# Patient Record
Sex: Female | Born: 2000
Health system: Southern US, Community
[De-identification: ages and names within clinical notes are randomized; demographics above are authoritative.]

## PROBLEM LIST (undated history)

## (undated) DIAGNOSIS — J45909 Unspecified asthma, uncomplicated: Secondary | ICD-10-CM

## (undated) DIAGNOSIS — H53001 Unspecified amblyopia, right eye: Secondary | ICD-10-CM

## (undated) DIAGNOSIS — H1010 Acute atopic conjunctivitis, unspecified eye: Secondary | ICD-10-CM

## (undated) DIAGNOSIS — R55 Syncope and collapse: Secondary | ICD-10-CM

## (undated) DIAGNOSIS — Z8719 Personal history of other diseases of the digestive system: Secondary | ICD-10-CM

## (undated) DIAGNOSIS — F410 Panic disorder [episodic paroxysmal anxiety] without agoraphobia: Secondary | ICD-10-CM

## (undated) DIAGNOSIS — J309 Allergic rhinitis, unspecified: Secondary | ICD-10-CM

## (undated) DIAGNOSIS — K529 Noninfective gastroenteritis and colitis, unspecified: Secondary | ICD-10-CM

## (undated) HISTORY — DX: Unspecified asthma, uncomplicated: J45.909

## (undated) HISTORY — DX: Acute atopic conjunctivitis, unspecified eye: H10.10

## (undated) HISTORY — DX: Syncope and collapse: R55

## (undated) HISTORY — PX: NO PAST SURGERIES: SHX2092

## (undated) HISTORY — DX: Panic disorder (episodic paroxysmal anxiety): F41.0

## (undated) HISTORY — DX: Personal history of other diseases of the digestive system: Z87.19

## (undated) HISTORY — DX: Allergic rhinitis, unspecified: J30.9

## (undated) HISTORY — DX: Noninfective gastroenteritis and colitis, unspecified: K52.9

## (undated) HISTORY — DX: Unspecified amblyopia, right eye: H53.001

---
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
## 2022-01-24 ENCOUNTER — Other Ambulatory Visit: Payer: Self-pay | Admitting: Pediatrics

## 2022-01-24 MED ORDER — NORGESTIM-ETH ESTRAD TRIPHASIC 0.18/0.215/0.25 MG-35 MCG PO TABS: 1.0000 | ORAL_TABLET | Freq: Every day | ORAL | 12 refills | Status: DC

## 2022-03-04 ENCOUNTER — Ambulatory Visit
Admission: RE | Admit: 2022-03-04 | Discharge: 2022-03-04 | Disposition: A | Discharge status: Home / Self Care | Payer: No Typology Code available for payment source | Source: Ambulatory Visit | Attending: Family Medicine | Admitting: Family Medicine

## 2022-03-04 VITALS — BP 106/65 | HR 81 | Temp 98.7°F | Resp 14

## 2022-03-04 DIAGNOSIS — L739 Follicular disorder, unspecified: Secondary | ICD-10-CM | POA: Diagnosis not present

## 2022-03-04 MED ORDER — SULFAMETHOXAZOLE-TRIMETHOPRIM 800-160 MG PO TABS: 1.0000 | ORAL_TABLET | Freq: Two times a day (BID) | ORAL | 0 refills | Status: AC

## 2022-03-04 NOTE — ED Provider Notes (Signed)
[facility]    CSN: [id number] Arrival date & time: 03/04/22  1323      History   Chief Complaint Chief Complaint  Patient presents with   Groin Pain    Lymph node is swollen - Entered by patient    HPI Tahnia Betker is a 21 y.o. female.   HPI 21 year old female presents with right-sided groin pain that has been an occurring for several weeks.  Patient reports palpable bump at site of pain appears to be bruised on the exterior.  She is accompanied by her mother this afternoon  Past Medical History:  Diagnosis Date   Allergic rhinoconjunctivitis    Chronic cough   Amblyopia ex anopsia of right eye    Also has an astigmatism   Asthma    Gastroenteritis    Generalized anxiety disorder with panic attacks    Reasonably well-controlled on sertraline   H/O gastroenteritis    Ongoing issues with nausea vomiting, poor p.o. tolerance, diarrhea.   Recurrent syncope     Patient Active Problem List   Diagnosis Date Noted   Syncope and collapse 02/20/2021   Orthostatic dizziness 02/20/2021   Asthma 01/25/2021   Gastroenteritis 01/25/2021   Panic attack 03/13/2020   Sleep disturbance 03/13/2020   Mixed obsessional thoughts and acts 03/13/2020   Adjustment disorder with anxiety 10/10/2015   Weight loss 10/10/2015   Mild persistent asthma 07/12/2015   Allergic rhinoconjunctivitis 07/12/2015    Past Surgical History:  Procedure Laterality Date   NO PAST SURGERIES      OB History   No obstetric history on file.      Home Medications    Prior to Admission medications   Medication Sig Start Date End Date Taking? Authorizing Provider  sulfamethoxazole-trimethoprim (BACTRIM DS) 800-160 MG tablet Take 1 tablet by mouth 2 (two) times daily for 7 days. 03/04/22 03/11/22 Yes Eliezer Lofts, FNP  BENZACLIN gel Apply topically every morning. 03/14/21   Parthenia Ames, NP  desvenlafaxine (PRISTIQ) 50 MG 24 hr tablet Take 1 tablet (50 mg total) by mouth daily. 12/06/21    Parthenia Ames, NP  dicyclomine (BENTYL) 20 MG tablet Take 20 mg by mouth 3 (three) times daily as needed. 02/03/21   [provider]  hydrOXYzine (ATARAX) 10 MG tablet Take 1 tablet (10 mg total) by mouth 3 (three) times daily as needed for anxiety. 12/06/21   Parthenia Ames, NP  midodrine (PROAMATINE) 2.5 MG tablet Take 1 tablet every morning 04/06/21   Leonie Man, MD  Norgestimate-Ethinyl Estradiol Triphasic (TRI-ESTARYLLA) 0.18/0.215/0.25 MG-35 MCG tablet Take 1 tablet by mouth daily. 01/24/22   Trude Mcburney, FNP  pantoprazole (PROTONIX) 20 MG tablet Take 20 mg by mouth every morning. Patient not taking: Reported on 03/04/2022 02/25/21   [provider]  Pediatric Multiple Vit-C-FA (CHILDRENS CHEWABLE VITAMINS) chewable tablet Chew by mouth.    [provider]  sertraline (ZOLOFT) 50 MG tablet Take 1 tablet (50 mg total) by mouth daily. 12/06/21   Parthenia Ames, NP  XIFAXAN 550 MG TABS tablet Take 550 mg by mouth 3 (three) times daily. 02/13/21   [provider]    Family History Family History  Problem Relation Age of Onset   Congenital heart disease Mother        PFO-status post closure   Stroke Mother 86       Related to be of a   Skin cancer Father    Asthma Brother  Allergic rhinitis Brother     Social History Social History   Tobacco Use   Smoking status: Never   Smokeless tobacco: Never  Substance Use Topics   Alcohol use: No    Alcohol/week: 0.0 standard drinks of alcohol   Drug use: No     Allergies   Patient has no known allergies.   Review of Systems Review of Systems  Musculoskeletal:        Right-sided groin pain x2 to 3 weeks  All other systems reviewed and are negative.    Physical Exam Triage Vital Signs ED Triage Vitals  Enc Vitals Group     BP 03/04/22 1339 106/65     Pulse Rate 03/04/22 1339 81     Resp 03/04/22 1339 14     Temp 03/04/22 1339 98.7 F (37.1 C)     Temp Source 03/04/22 1339  Oral     SpO2 03/04/22 1339 98 %     Weight --      Height --      Head Circumference --      Peak Flow --      Pain Score 03/04/22 1338 0     Pain Loc --      Pain Edu? --      Excl. in GC? --    No data found.  Updated Vital Signs BP 106/65 (BP Location: Left Arm)   Pulse 81   Temp 98.7 F (37.1 C) (Oral)   Resp 14   SpO2 98%       Physical Exam Vitals and nursing note reviewed. Exam conducted with a chaperone present.  Constitutional:      General: She is not in acute distress.    Appearance: Normal appearance. She is normal weight. She is not ill-appearing.  HENT:     Head: Normocephalic and atraumatic.     Mouth/Throat:     Mouth: Mucous membranes are moist.     Pharynx: Oropharynx is clear.  Eyes:     Extraocular Movements: Extraocular movements intact.     Conjunctiva/sclera: Conjunctivae normal.     Pupils: Pupils are equal, round, and reactive to light.  Cardiovascular:     Rate and Rhythm: Normal rate and regular rhythm.     Pulses: Normal pulses.     Heart sounds: Normal heart sounds.  Pulmonary:     Effort: Pulmonary effort is normal.     Breath sounds: Normal breath sounds. No wheezing, rhonchi or rales.  Musculoskeletal:        General: Normal range of motion.     Cervical back: Normal range of motion and neck supple.  Skin:    General: Skin is warm and dry.     Comments: Right inguinal crease: Tiny (<0.5 cm) circular shaped mildly erythematous pustular papule, no discharge/drainage/bleeding, no evidence of engorged lymph node of right sided groin noted  Neurological:     General: No focal deficit present.     Mental Status: She is alert and oriented to person, place, and time.      UC Treatments / Results  Labs (all labs ordered are listed, but only abnormal results are displayed) Labs Reviewed - No data to display  EKG   Radiology No results found.  Procedures Procedures (including critical care time)  Medications Ordered in  UC Medications - No data to display  Initial Impression / Assessment and Plan / UC Course  I have reviewed the triage vital signs and the nursing notes.  Pertinent labs & imaging results that were available during my care of the patient were reviewed by me and considered in my medical decision making (see chart for details).     MDM: 1.  Folliculitis-Rx'd Bactrim. Advised patient to take medication as directed with food to completion.  Encouraged patient increase daily water intake while taking this medication.  Advised patient if symptoms worsen and/or unresolved please follow-up with PCP, GYN, or here for further evaluation.  Patient discharged home, hemodynamically stable. Final Clinical Impressions(s) / UC Diagnoses   Final diagnoses:  Folliculitis     Discharge Instructions      Advised patient to take medication as directed with food to completion.  Encouraged patient increase daily water intake while taking this medication.  Advised patient if symptoms worsen and/or unresolved please follow-up with PCP, GYN, or here for further evaluation.     ED Prescriptions     Medication Sig Dispense Auth. Provider   sulfamethoxazole-trimethoprim (BACTRIM DS) 800-160 MG tablet Take 1 tablet by mouth 2 (two) times daily for 7 days. 14 tablet Trevor Iha, FNP      PDMP not reviewed this encounter.   Trevor Iha, FNP 03/04/22 1429

## 2022-03-04 NOTE — ED Triage Notes (Signed)
Pt presents with rt sided groin pain that has been occurring "for several weeks". Pt states there is a palpable bump at the site of the pain that appears to be bruised on the exterior.

## 2022-03-04 NOTE — Discharge Instructions (Addendum)
Advised patient to take medication as directed with food to completion.  Encouraged patient increase daily water intake while taking this medication.  Advised patient if symptoms worsen and/or unresolved please follow-up with PCP, GYN, or here for further evaluation.

## 2022-03-05 ENCOUNTER — Telehealth: Payer: Self-pay

## 2022-03-05 NOTE — Telephone Encounter (Signed)
Patient called to see if she could get her in-person appointment for 03/06/22 changed to virtual.  Patient stated that she starts school on tomorrow.  Provider and front desk supervisor consulted.  Appointment able to be modified.  Patient made aware of the change.

## 2022-03-06 ENCOUNTER — Encounter: Payer: Self-pay | Admitting: Family

## 2022-03-06 ENCOUNTER — Telehealth (INDEPENDENT_AMBULATORY_CARE_PROVIDER_SITE_OTHER): Payer: No Typology Code available for payment source | Admitting: Family

## 2022-03-06 DIAGNOSIS — F4323 Adjustment disorder with mixed anxiety and depressed mood: Secondary | ICD-10-CM

## 2022-03-06 MED ORDER — DESVENLAFAXINE SUCCINATE ER 50 MG PO TB24: 50.0000 mg | ORAL_TABLET | Freq: Every day | ORAL | 0 refills | Status: DC

## 2022-03-06 MED ORDER — SERTRALINE HCL 50 MG PO TABS: 50.0000 mg | ORAL_TABLET | Freq: Every day | ORAL | 0 refills | Status: DC

## 2022-03-06 NOTE — Progress Notes (Signed)
THIS RECORD MAY CONTAIN CONFIDENTIAL INFORMATION THAT SHOULD NOT BE RELEASED WITHOUT REVIEW OF THE SERVICE PROVIDER.  Virtual Follow-Up Visit via Video Note  I connected with [facility]  on 03/06/22 at  1:30 PM EDT by a video enabled telemedicine application and verified that I am speaking with the correct person using two identifiers.   Patient/parent location: home  Provider location: office, [place]    I discussed the limitations of evaluation and management by telemedicine and the availability of in person appointments.  I discussed that the purpose of this telehealth visit is to provide medical care while limiting exposure to the novel coronavirus.  The patient expressed understanding and agreed to proceed.   Valerie Mason is a 21 y.o. female referred by Armandina Stammer, MD here today for follow-up of adjustment disorder with mixed anxiety and depressed mood.    History was provided by the patient.  Supervising Physician: Dr. Delorse Lek   Assessment/Plan from last visit on 12/06/21  1. Adjustment disorder with mixed anxiety and depressed mood 2. Sleep disturbance   -continue with sertraline 50 mg and Pristiq 50 mg  -advised to reach out if any changes in mood/emotion; agreeable to plan  -return in 3 months or sooner if needed; repeat PHQSADS at that time     Chief Complaint: Adjustment disorder with mixed anxiety and depressed mood  History of Present Illness:  -has been taking Bactrim for folliculitis  -a little more anxiety now due to start of semester yesterday  -taking medications as prescribed and feels they are working well  -no concerns with medications  -appetite good, sleep good; no SI/HI    No Known Allergies Outpatient Medications Prior to Visit  Medication Sig Dispense Refill   BENZACLIN gel Apply topically every morning. 25 g 11   dicyclomine (BENTYL) 20 MG tablet Take 20 mg by mouth 3 (three) times daily as needed.     hydrOXYzine (ATARAX) 10 MG  tablet Take 1 tablet (10 mg total) by mouth 3 (three) times daily as needed for anxiety. 180 tablet 0   midodrine (PROAMATINE) 2.5 MG tablet Take 1 tablet every morning 35 tablet 6   Norgestimate-Ethinyl Estradiol Triphasic (TRI-ESTARYLLA) 0.18/0.215/0.25 MG-35 MCG tablet Take 1 tablet by mouth daily. 28 tablet 12   pantoprazole (PROTONIX) 20 MG tablet Take 20 mg by mouth every morning. (Patient not taking: Reported on 03/04/2022)     Pediatric Multiple Vit-C-FA (CHILDRENS CHEWABLE VITAMINS) chewable tablet Chew by mouth.     sulfamethoxazole-trimethoprim (BACTRIM DS) 800-160 MG tablet Take 1 tablet by mouth 2 (two) times daily for 7 days. 14 tablet 0   XIFAXAN 550 MG TABS tablet Take 550 mg by mouth 3 (three) times daily.     desvenlafaxine (PRISTIQ) 50 MG 24 hr tablet Take 1 tablet (50 mg total) by mouth daily. 90 tablet 0   sertraline (ZOLOFT) 50 MG tablet Take 1 tablet (50 mg total) by mouth daily. 90 tablet 0   No facility-administered medications prior to visit.     Patient Active Problem List   Diagnosis Date Noted   Syncope and collapse 02/20/2021   Orthostatic dizziness 02/20/2021   Asthma 01/25/2021   Gastroenteritis 01/25/2021   Panic attack 03/13/2020   Sleep disturbance 03/13/2020   Mixed obsessional thoughts and acts 03/13/2020   Adjustment disorder with anxiety 10/10/2015   Weight loss 10/10/2015   Mild persistent asthma 07/12/2015   Allergic rhinoconjunctivitis 07/12/2015   The following portions of the patient's history were reviewed and updated  as appropriate: current medications and past medical history.  Visual Observations/Objective:  General Appearance: Well nourished well developed, in no apparent distress. Eyes: conjunctiva no swelling or erythema ENT/Mouth: No hoarseness, No cough for duration of visit.  Neck: Supple  Respiratory: Respiratory effort normal, normal rate, no retractions or distress.   Cardio: Appears well-perfused, noncyanotic Musculoskeletal:  no obvious deformity Skin: visible skin without rashes, ecchymosis, erythema Neuro: Awake and oriented X 3,  Psych:  normal affect, Insight and Judgment appropriate.    Assessment/Plan:  1. Adjustment disorder with mixed anxiety and depressed mood -continue with current regimen  -recommended she reach out if anxiety around school start persists -otherwise return in 3 months   I discussed the assessment and treatment plan with the patient and/or parent/guardian.  They were provided an opportunity to ask questions and all were answered.  They agreed with the plan and demonstrated an understanding of the instructions. They were advised to call back or seek an in-person evaluation in the emergency room if the symptoms worsen or if the condition fails to improve as anticipated.   Follow-up:   3 months or sooner if needed    [name], NP    CC: Armandina Stammer, MD, Armandina Stammer, MD

## 2022-03-08 ENCOUNTER — Ambulatory Visit: Payer: No Typology Code available for payment source | Admitting: Family

## 2022-04-02 ENCOUNTER — Other Ambulatory Visit: Payer: Self-pay | Admitting: Family

## 2022-06-05 ENCOUNTER — Telehealth (INDEPENDENT_AMBULATORY_CARE_PROVIDER_SITE_OTHER): Payer: No Typology Code available for payment source | Admitting: Family

## 2022-06-05 DIAGNOSIS — F4323 Adjustment disorder with mixed anxiety and depressed mood: Secondary | ICD-10-CM | POA: Diagnosis not present

## 2022-06-05 DIAGNOSIS — F422 Mixed obsessional thoughts and acts: Secondary | ICD-10-CM | POA: Diagnosis not present

## 2022-06-05 NOTE — Progress Notes (Unsigned)
THIS RECORD MAY CONTAIN CONFIDENTIAL INFORMATION THAT SHOULD NOT BE RELEASED WITHOUT REVIEW OF THE SERVICE PROVIDER.  Virtual Follow-Up Visit via Video Note  I connected with Valerie Mason 's {family members:[id number]}  on 06/05/22 at 11:30 AM EST by a video enabled telemedicine application and verified that I am speaking with the correct person using two identifiers.   Patient/parent location: ***   I discussed the limitations of evaluation and management by telemedicine and the availability of in person appointments.  I discussed that the purpose of this telehealth visit is to provide medical care while limiting exposure to the novel coronavirus.  The {family members:20773} expressed understanding and agreed to proceed.   Valerie Mason is a 21 y.o. female referred by Armandina Stammer, MD here today for follow-up of adjustment disorder with mixed anxity   Previsit planning completed:  {YES/NO/NOT APPLICABLE:20182}   History was provided by the {CHL AMB PERSONS; PED RELATIVES/OTHER W/PATIENT:[id number]}.  Supervising Physician: Dr. Theadore Nan   Plan from Last Visit:   ***  Chief Complaint: ***  History of Present Illness:  -just finished Accutane  -LMP 3 weeks    No Known Allergies Outpatient Medications Prior to Visit  Medication Sig Dispense Refill   BENZACLIN gel Apply topically every morning. 25 g 11   desvenlafaxine (PRISTIQ) 50 MG 24 hr tablet Take 1 tablet (50 mg total) by mouth daily. 90 tablet 0   dicyclomine (BENTYL) 20 MG tablet Take 20 mg by mouth 3 (three) times daily as needed.     hydrOXYzine (ATARAX) 10 MG tablet TAKE 1 TABLET BY MOUTH 3 TIMES DAILY AS NEEDED FOR ANXIETY. 180 tablet 0   midodrine (PROAMATINE) 2.5 MG tablet Take 1 tablet every morning 35 tablet 6   Norgestimate-Ethinyl Estradiol Triphasic (TRI-ESTARYLLA) 0.18/0.215/0.25 MG-35 MCG tablet Take 1 tablet by mouth daily. 28 tablet 12   pantoprazole (PROTONIX) 20 MG tablet Take 20 mg by mouth every  morning. (Patient not taking: Reported on 03/04/2022)     Pediatric Multiple Vit-C-FA (CHILDRENS CHEWABLE VITAMINS) chewable tablet Chew by mouth.     sertraline (ZOLOFT) 50 MG tablet Take 1 tablet (50 mg total) by mouth daily. 90 tablet 0   XIFAXAN 550 MG TABS tablet Take 550 mg by mouth 3 (three) times daily.     No facility-administered medications prior to visit.     Patient Active Problem List   Diagnosis Date Noted   Syncope and collapse 02/20/2021   Orthostatic dizziness 02/20/2021   Asthma 01/25/2021   Gastroenteritis 01/25/2021   Panic attack 03/13/2020   Sleep disturbance 03/13/2020   Mixed obsessional thoughts and acts 03/13/2020   Adjustment disorder with anxiety 10/10/2015   Weight loss 10/10/2015   Mild persistent asthma 07/12/2015   Allergic rhinoconjunctivitis 07/12/2015    Social History: Lives with:  {Persons; PED relatives w/patient:19415} and describes home situation as *** School: In Grade *** at [employer] Future Plans:  {CHL AMB PED FUTURE RJJOA:[id number]} Exercise:  {Exercise:23478} Sports:  {Misc; sports:10024} Sleep:  {SX; SLEEP PATTERNS:18802}  Confidentiality was discussed with the patient and if applicable, with caregiver as well.  Patient's personal or confidential phone number: *** Enter confidential phone number in Family Comments section of [employer] Tobacco?  {YES/NO/WILD WFUXN:23557} Drugs/ETOH?  {YES/NO/WILD DUKGU:54270} Partner preference?  {CHL AMB PARTNER PREFERENCE:[phone]} Sexually Active?  {YES/NO/WILD WCBJS:28315}  Pregnancy Prevention:  {Pregnancy Prevention:[phone]}, reviewed condoms & plan B Trauma currently or in the pastt?  {YES/NO/WILD VVOHY:07371} Suicidal or Self-Harm thoughts?   {YES/NO/WILD GGYIR:48546} Guns in the  home?  {YES/NO/WILD CARDS:18581}  {Common ambulatory SmartLinks:19316}  Visual Observations/Objective:  *** General Appearance: Well nourished well developed, in no apparent distress.  Eyes:  conjunctiva no swelling or erythema ENT/Mouth: No hoarseness, No cough for duration of visit.  Neck: Supple  Respiratory: Respiratory effort normal, normal rate, no retractions or distress.   Cardio: Appears well-perfused, noncyanotic Musculoskeletal: no obvious deformity Skin: visible skin without rashes, ecchymosis, erythema Neuro: Awake and oriented X 3,  Psych:  normal affect, Insight and Judgment appropriate.    Assessment/Plan: There are no diagnoses linked to this encounter.  BH screenings:     06/08/2020    1:44 PM 04/04/2020   10:26 AM 12/09/2019    1:45 PM  PHQ-SADS Last 3 Score only  PHQ-15 Score 15 22 8   Total GAD-7 Score 10 19 4   PHQ Adolescent Score 14 16 3    *** Screens discussed with patient and parent and adjustments to plan made accordingly.   I discussed the assessment and treatment plan with the patient and/or parent/guardian.  They were provided an opportunity to ask questions and all were answered.  They agreed with the plan and demonstrated an understanding of the instructions. They were advised to call back or seek an in-person evaluation in the emergency room if the symptoms worsen or if the condition fails to improve as anticipated.   Follow-up:   ***  I spent >*** minutes spent face to face with patient with more than 50% of appointment spent discussing diagnosis, management, follow-up, and reviewing of ***. I spent an additional *** minutes on pre-and post-visit activities. I was located *** during this encounter.   , NP    CC: , MD, , MD

## 2022-06-07 ENCOUNTER — Encounter: Payer: Self-pay | Admitting: Family

## 2022-06-07 MED ORDER — DESVENLAFAXINE SUCCINATE ER 50 MG PO TB24: 50.0000 mg | ORAL_TABLET | Freq: Every day | ORAL | 0 refills | Status: DC

## 2022-06-07 MED ORDER — SERTRALINE HCL 50 MG PO TABS: 50.0000 mg | ORAL_TABLET | Freq: Every day | ORAL | 0 refills | Status: DC

## 2022-08-22 DIAGNOSIS — F411 Generalized anxiety disorder: Secondary | ICD-10-CM | POA: Diagnosis not present

## 2022-09-03 ENCOUNTER — Encounter: Payer: Self-pay | Admitting: Family

## 2022-09-03 ENCOUNTER — Telehealth (INDEPENDENT_AMBULATORY_CARE_PROVIDER_SITE_OTHER): Payer: Self-pay | Admitting: Family

## 2022-09-03 DIAGNOSIS — F4323 Adjustment disorder with mixed anxiety and depressed mood: Secondary | ICD-10-CM

## 2022-09-03 DIAGNOSIS — F422 Mixed obsessional thoughts and acts: Secondary | ICD-10-CM

## 2022-09-03 DIAGNOSIS — G479 Sleep disorder, unspecified: Secondary | ICD-10-CM

## 2022-09-03 NOTE — Progress Notes (Signed)
THIS RECORD MAY CONTAIN CONFIDENTIAL INFORMATION THAT SHOULD NOT BE RELEASED WITHOUT REVIEW OF THE SERVICE PROVIDER.  Virtual Follow-Up Visit via Video Note  I connected with Valerie Mason 's   on 09/03/22 at  1:30 PM EST by a video enabled telemedicine application and verified that I am speaking with the correct person using two identifiers.   Patient/parent location: home Provider location: [facility] office    I discussed the limitations of evaluation and management by telemedicine and the availability of in person appointments.  I discussed that the purpose of this telehealth visit is to provide medical care while limiting exposure to the novel coronavirus.  The patient expressed understanding and agreed to proceed.   Valerie Mason is a 22 y.o. female referred by Jamie Kato, MD here today for follow-up of adjustment disorder with mixed anxiety and depressed mood.   History was provided by the patient.  Supervising Physician: Dr. Roselind Messier   Plan from Last Visit:   -negative PHQSADS indicating no anxiety/depressive symptoms -return in 3 months or sooner as needed, 90 day refill sent   1. Adjustment disorder with mixed anxiety and depressed mood 2. Mixed obsessional thoughts and acts - desvenlafaxine (PRISTIQ) 50 MG 24 hr tablet; Take 1 tablet (50 mg total) by mouth daily.  Dispense: 90 each; Refill: 0 - sertraline (ZOLOFT) 50 MG tablet; Take 1 tablet (50 mg total) by mouth daily.  Dispense: 90 tablet; Refill: 0  Chief Complaint: -does not need refills at this time, no concerns; medication management   History of Present Illness:  -taking meds as prescribed; recently picked up sertraline and Pristiq from pharmacy  -LMP last week  -no concerns, mood has been good; continues to see Miquel Dunn for therapy  -no headaches, no vision changes (glasses up to date), no stomachaches, no chest pain or SOB -safe to self   No Known Allergies Outpatient Medications Prior to Visit   Medication Sig Dispense Refill   BENZACLIN gel Apply topically every morning. 25 g 11   desvenlafaxine (PRISTIQ) 50 MG 24 hr tablet Take 1 tablet (50 mg total) by mouth daily. 90 each 0   dicyclomine (BENTYL) 20 MG tablet Take 20 mg by mouth 3 (three) times daily as needed.     hydrOXYzine (ATARAX) 10 MG tablet TAKE 1 TABLET BY MOUTH 3 TIMES DAILY AS NEEDED FOR ANXIETY. 180 tablet 0   midodrine (PROAMATINE) 2.5 MG tablet Take 1 tablet every morning 35 tablet 6   Norgestimate-Ethinyl Estradiol Triphasic (TRI-ESTARYLLA) 0.18/0.215/0.25 MG-35 MCG tablet Take 1 tablet by mouth daily. 28 tablet 12   pantoprazole (PROTONIX) 20 MG tablet Take 20 mg by mouth every morning. (Patient not taking: Reported on 03/04/2022)     Pediatric Multiple Vit-C-FA (CHILDRENS CHEWABLE VITAMINS) chewable tablet Chew by mouth.     sertraline (ZOLOFT) 50 MG tablet Take 1 tablet (50 mg total) by mouth daily. 90 tablet 0   XIFAXAN 550 MG TABS tablet Take 550 mg by mouth 3 (three) times daily.     No facility-administered medications prior to visit.     Patient Active Problem List   Diagnosis Date Noted   Syncope and collapse 02/20/2021   Orthostatic dizziness 02/20/2021   Asthma 01/25/2021   Gastroenteritis 01/25/2021   Panic attack 03/13/2020   Sleep disturbance 03/13/2020   Mixed obsessional thoughts and acts 03/13/2020   Adjustment disorder with anxiety 10/10/2015   Weight loss 10/10/2015   Mild persistent asthma 07/12/2015   Allergic rhinoconjunctivitis 07/12/2015  The following portions of the patient's history were reviewed and updated as appropriate: allergies, current medications, past family history, past medical history, past social history, past surgical history, and problem list.  Visual Observations/Objective:   General Appearance: Well nourished well developed, in no apparent distress.  Eyes: conjunctiva no swelling or erythema; wearing under-eye patches for puffy eyes 2/2 allergies   ENT/Mouth: No hoarseness, No cough for duration of visit. No hyponasal speech noted; no nasal congestion.  Neck: Supple  Respiratory: Respiratory effort normal, normal rate, no retractions or distress.   Cardio: Appears well-perfused, noncyanotic Musculoskeletal: no obvious deformity Skin: visible skin without rashes, ecchymosis, erythema Neuro: Awake and oriented X 3,  Psych:  normal affect, Insight and Judgment appropriate.    Assessment/Plan:  1. Adjustment disorder with mixed anxiety and depressed mood 2. Mixed obsessional thoughts and acts 3. Sleep disturbance  -continue with sertraline 50 mg, Pristiq 50 mg  -return in 3 months or sooner if needed  Screens discussed with patient and parent and adjustments to plan made accordingly.   I discussed the assessment and treatment plan with the patient and/or parent/guardian.  They were provided an opportunity to ask questions and all were answered.  They agreed with the plan and demonstrated an understanding of the instructions. They were advised to call back or seek an in-person evaluation in the emergency room if the symptoms worsen or if the condition fails to improve as anticipated.   Follow-up:   3 months in person    Parthenia Ames, NP    CC: Jamie Kato, MD, Jamie Kato, MD

## 2022-10-03 DIAGNOSIS — F411 Generalized anxiety disorder: Secondary | ICD-10-CM | POA: Diagnosis not present

## 2022-11-05 DIAGNOSIS — F411 Generalized anxiety disorder: Secondary | ICD-10-CM | POA: Diagnosis not present

## 2022-11-21 ENCOUNTER — Encounter: Payer: Self-pay | Admitting: Family

## 2022-11-21 ENCOUNTER — Telehealth (INDEPENDENT_AMBULATORY_CARE_PROVIDER_SITE_OTHER): Payer: Self-pay | Admitting: Family

## 2022-11-21 DIAGNOSIS — F4323 Adjustment disorder with mixed anxiety and depressed mood: Secondary | ICD-10-CM

## 2022-11-21 DIAGNOSIS — G479 Sleep disorder, unspecified: Secondary | ICD-10-CM

## 2022-11-21 DIAGNOSIS — F422 Mixed obsessional thoughts and acts: Secondary | ICD-10-CM

## 2022-11-21 MED ORDER — DESVENLAFAXINE SUCCINATE ER 50 MG PO TB24: 50.0000 mg | ORAL_TABLET | Freq: Every day | ORAL | 0 refills | Status: DC

## 2022-11-21 MED ORDER — SERTRALINE HCL 50 MG PO TABS: 50.0000 mg | ORAL_TABLET | Freq: Every day | ORAL | 0 refills | Status: DC

## 2022-11-21 NOTE — Progress Notes (Signed)
THIS RECORD MAY CONTAIN CONFIDENTIAL INFORMATION THAT SHOULD NOT BE RELEASED WITHOUT REVIEW OF THE SERVICE PROVIDER.  Virtual Follow-Up Visit via Video Note  I connected with [facility]  on 11/21/22 at 11:00 AM EDT by a video enabled telemedicine application and verified that I am speaking with the correct person using two identifiers.   Patient/parent location: home  Provider location: remote, [place]   I discussed the limitations of evaluation and management by telemedicine and the availability of in person appointments.  I discussed that the purpose of this telehealth visit is to provide medical care while limiting exposure to the novel coronavirus.  The patient expressed understanding and agreed to proceed.   Valerie Mason is a 22 y.o. female referred by Izola Price, MD here today for follow-up of adjustment disorder with mixed anxiety and depressed mood, mixed obsessional thoughts and acts, sleep disturbance.    History was provided by the patient.  Supervising Physician: Dr. Theadore Nan   Plan from Last Visit:   1. Adjustment disorder with mixed anxiety and depressed mood 2. Mixed obsessional thoughts and acts 3. 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 25 g 11   desvenlafaxine (PRISTIQ) 50 MG 24 hr tablet Take 1 tablet (50 mg total) by mouth daily. 90 each 0   dicyclomine (BENTYL) 20 MG tablet Take 20 mg by mouth 3 (three) times daily as needed.     hydrOXYzine (ATARAX) 10 MG tablet TAKE 1 TABLET BY MOUTH 3 TIMES DAILY AS NEEDED FOR ANXIETY. 180 tablet 0   midodrine (PROAMATINE) 2.5 MG tablet Take 1 tablet every morning 35 tablet 6   Norgestimate-Ethinyl Estradiol Triphasic (TRI-ESTARYLLA) 0.18/0.215/0.25 MG-35 MCG tablet Take 1 tablet by mouth daily. 28 tablet 12   sertraline (ZOLOFT) 50 MG tablet Take 1 tablet (50 mg total) by mouth daily. 90 tablet 0   XIFAXAN 550 MG TABS tablet Take 550 mg by mouth 3 (three) times daily.     No facility-administered medications prior to visit.     Patient Active Problem List   Diagnosis Date Noted   Syncope and collapse 02/20/2021   Orthostatic dizziness 02/20/2021   Asthma 01/25/2021   Gastroenteritis 01/25/2021   Panic attack 03/13/2020   Sleep disturbance 03/13/2020   Mixed obsessional thoughts and acts 03/13/2020   Adjustment disorder with anxiety 10/10/2015   Weight loss 10/10/2015   Mild persistent asthma 07/12/2015   Allergic rhinoconjunctivitis 07/12/2015    The following portions of the patient's history were reviewed and updated as appropriate: allergies, current medications, past family history, past medical history, past social history, past surgical history, and problem list.  Visual Observations/Objective:   General Appearance: Well nourished well developed, in no apparent distress.  Eyes: conjunctiva no  swelling or erythema ENT/Mouth: No hoarseness, No cough for duration of visit.  Neck: Supple  Respiratory: Respiratory effort normal, normal rate, no retractions or distress.   Cardio: Appears well-perfused, noncyanotic Musculoskeletal: no obvious deformity Skin: visible skin without rashes, ecchymosis, erythema Neuro: Awake and oriented X 3,   Psych:  normal affect, Insight and Judgment appropriate.    Assessment/Plan: 1. Adjustment disorder with mixed anxiety and depressed mood 2. Mixed obsessional thoughts and acts 3. Sleep disturbance -stable on current regimen -sertraline 50 mg, Pristiq 50 mg  -continue with regimen, return as needed or in 3 months     I discussed the assessment and treatment plan with the patient and/or parent/guardian.  They were provided an opportunity to ask questions and all were answered.  They agreed with the plan and demonstrated an understanding of the instructions. They were advised to call back or seek an in-person evaluation in the emergency room if the symptoms worsen or if the condition fails to improve as anticipated.   Follow-up:   3 months video or in person    [name], NP    CC: Izola Price, MD, Izola Price, MD

## 2022-12-11 DIAGNOSIS — D1801 Hemangioma of skin and subcutaneous tissue: Secondary | ICD-10-CM | POA: Diagnosis not present

## 2023-01-10 DIAGNOSIS — L918 Other hypertrophic disorders of the skin: Secondary | ICD-10-CM | POA: Diagnosis not present

## 2023-01-10 DIAGNOSIS — D2272 Melanocytic nevi of left lower limb, including hip: Secondary | ICD-10-CM | POA: Diagnosis not present

## 2023-01-10 DIAGNOSIS — D224 Melanocytic nevi of scalp and neck: Secondary | ICD-10-CM | POA: Diagnosis not present

## 2023-02-16 ENCOUNTER — Other Ambulatory Visit: Payer: Self-pay | Admitting: Family

## 2023-02-16 DIAGNOSIS — F422 Mixed obsessional thoughts and acts: Secondary | ICD-10-CM

## 2023-02-16 DIAGNOSIS — F4323 Adjustment disorder with mixed anxiety and depressed mood: Secondary | ICD-10-CM

## 2023-02-20 ENCOUNTER — Telehealth: Payer: Self-pay | Admitting: *Deleted

## 2023-02-20 ENCOUNTER — Other Ambulatory Visit: Payer: Self-pay | Admitting: Family

## 2023-02-20 DIAGNOSIS — F4323 Adjustment disorder with mixed anxiety and depressed mood: Secondary | ICD-10-CM

## 2023-02-20 DIAGNOSIS — F422 Mixed obsessional thoughts and acts: Secondary | ICD-10-CM

## 2023-02-20 MED ORDER — SERTRALINE HCL 50 MG PO TABS: 50.0000 mg | ORAL_TABLET | Freq: Every day | ORAL | 0 refills | Status: DC

## 2023-02-20 MED ORDER — NORGESTIM-ETH ESTRAD TRIPHASIC 0.18/0.215/0.25 MG-35 MCG PO TABS: 1.0000 | ORAL_TABLET | Freq: Every day | ORAL | 12 refills | Status: DC

## 2023-02-20 NOTE — Telephone Encounter (Signed)
Valerie Mason left message on refill line 02/19/23 for Zoloft (prescribed 02/18/23) and for "birth control pills".

## 2023-04-22 ENCOUNTER — Other Ambulatory Visit: Payer: Self-pay | Admitting: Family

## 2023-04-27 DIAGNOSIS — R112 Nausea with vomiting, unspecified: Secondary | ICD-10-CM | POA: Diagnosis not present

## 2023-04-27 DIAGNOSIS — R197 Diarrhea, unspecified: Secondary | ICD-10-CM | POA: Diagnosis not present

## 2023-04-27 DIAGNOSIS — T63481A Toxic effect of venom of other arthropod, accidental (unintentional), initial encounter: Secondary | ICD-10-CM | POA: Diagnosis not present

## 2023-08-04 ENCOUNTER — Other Ambulatory Visit: Payer: Self-pay | Admitting: Family

## 2023-08-04 DIAGNOSIS — F422 Mixed obsessional thoughts and acts: Secondary | ICD-10-CM

## 2023-08-04 DIAGNOSIS — F4323 Adjustment disorder with mixed anxiety and depressed mood: Secondary | ICD-10-CM

## 2023-10-02 ENCOUNTER — Other Ambulatory Visit: Payer: Self-pay | Admitting: Family

## 2023-10-16 ENCOUNTER — Other Ambulatory Visit: Payer: Self-pay | Admitting: Family

## 2023-11-06 ENCOUNTER — Other Ambulatory Visit: Payer: Self-pay | Admitting: Family

## 2023-11-06 DIAGNOSIS — F4323 Adjustment disorder with mixed anxiety and depressed mood: Secondary | ICD-10-CM

## 2023-11-06 DIAGNOSIS — F422 Mixed obsessional thoughts and acts: Secondary | ICD-10-CM

## 2023-11-11 ENCOUNTER — Other Ambulatory Visit: Payer: Self-pay | Admitting: Family

## 2023-11-21 ENCOUNTER — Other Ambulatory Visit: Payer: Self-pay | Admitting: Family

## 2023-11-21 DIAGNOSIS — F4323 Adjustment disorder with mixed anxiety and depressed mood: Secondary | ICD-10-CM

## 2023-11-21 DIAGNOSIS — F422 Mixed obsessional thoughts and acts: Secondary | ICD-10-CM

## 2023-11-25 ENCOUNTER — Telehealth: Payer: Self-pay

## 2023-11-25 NOTE — Telephone Encounter (Signed)
 Patient requesting refill of Setraline 50 mg tablet.

## 2023-11-27 ENCOUNTER — Encounter: Payer: Self-pay | Admitting: Family

## 2023-12-05 DIAGNOSIS — L219 Seborrheic dermatitis, unspecified: Secondary | ICD-10-CM | POA: Diagnosis not present

## 2023-12-09 ENCOUNTER — Encounter: Payer: Self-pay | Admitting: Family

## 2023-12-09 ENCOUNTER — Telehealth: Payer: Self-pay | Admitting: Family

## 2023-12-10 ENCOUNTER — Encounter: Payer: Self-pay | Admitting: Family

## 2023-12-10 ENCOUNTER — Ambulatory Visit (INDEPENDENT_AMBULATORY_CARE_PROVIDER_SITE_OTHER): Payer: Self-pay | Admitting: Family

## 2023-12-10 VITALS — BP 104/66 | HR 71 | Ht 64.27 in | Wt 140.2 lb

## 2023-12-10 DIAGNOSIS — Z3041 Encounter for surveillance of contraceptive pills: Secondary | ICD-10-CM | POA: Diagnosis not present

## 2023-12-10 DIAGNOSIS — G479 Sleep disorder, unspecified: Secondary | ICD-10-CM

## 2023-12-10 DIAGNOSIS — F4323 Adjustment disorder with mixed anxiety and depressed mood: Secondary | ICD-10-CM

## 2023-12-10 DIAGNOSIS — F422 Mixed obsessional thoughts and acts: Secondary | ICD-10-CM

## 2023-12-10 DIAGNOSIS — Z113 Encounter for screening for infections with a predominantly sexual mode of transmission: Secondary | ICD-10-CM | POA: Diagnosis not present

## 2023-12-10 DIAGNOSIS — Z3202 Encounter for pregnancy test, result negative: Secondary | ICD-10-CM

## 2023-12-10 LAB — POCT URINE PREGNANCY: Preg Test, Ur: NEGATIVE

## 2023-12-10 NOTE — Progress Notes (Signed)
 History was provided by the patient.  Valerie Mason is a 23 y.o. female who is here for adjustment disorder with mixed anxiety and depressed mood, mixed obsessional thoughts and acts, and sleep disturbance.  PCP confirmed? Yes.    Alvin Annalyce Lanpher, MD  Plan from last visit:  1. Adjustment disorder with mixed anxiety and depressed mood 2. Mixed obsessional thoughts and acts 3. 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  -not taking hydroxyzine  very often at all anymore -LMP 2 weeks, managing cramps well with OCPs  SSRI Side Effects: GI Upset:  mom has gone gluten free and diet is the same pretty much but does not have time to care; working for her (just less gluten)  Change in Appetite:  none Daytime Drowsiness:  no Sleep Issues:  sometimes up more than she should; waking up rested Headaches:  no Dizziness:  no Tremor:  no Heart Palpitations:  no Sweating:  no Irritability:  no Decreased Libido:  yes; no pain intercourse Patient compliant with medication:  no Suicidal Ideation:  no Self Harm:  no     12/12/2023    8:52 AM 06/05/2022   12:15 PM 06/08/2020    1:44 PM  PHQ-SADS Last 3 Score only  PHQ-15 Score 2 8 15   Total GAD-7 Score 0 1 10  PHQ Adolescent Score 0 1 14     Patient Active Problem List   Diagnosis Date Noted   Syncope and collapse 02/20/2021   Orthostatic dizziness 02/20/2021   Asthma 01/25/2021   Gastroenteritis 01/25/2021   Panic attack 03/13/2020   Sleep disturbance 03/13/2020   Mixed obsessional thoughts and acts 03/13/2020   Adjustment disorder with anxiety 10/10/2015   Weight loss 10/10/2015   Mild persistent asthma 07/12/2015   Allergic rhinoconjunctivitis 07/12/2015     Current Outpatient Medications on File Prior to Visit  Medication Sig Dispense Refill   BENZACLIN  gel Apply topically every morning. 25 g 11   hydrOXYzine  (ATARAX ) 10 MG tablet TAKE 1 TABLET BY MOUTH THREE TIMES A DAY AS NEEDED FOR ANXIETY 180 tablet 0   Norgestimate-Ethinyl Estradiol Triphasic (TRI-ESTARYLLA ) 0.18/0.215/0.25 MG-35 MCG tablet Take 1 tablet by mouth daily. 28 tablet 12   sertraline  (ZOLOFT ) 50 MG tablet TAKE 1 TABLET BY MOUTH EVERY DAY 90 tablet 0   desvenlafaxine  (PRISTIQ ) 50 MG 24 hr tablet Take 1 tablet (50 mg total) by mouth daily. (Patient not taking: Reported on 12/10/2023) 90 tablet 0   dicyclomine (BENTYL) 20 MG tablet Take 20 mg by mouth 3 (three) times daily as needed. (Patient not taking: Reported on 12/10/2023)     midodrine  (PROAMATINE ) 2.5 MG tablet Take 1 tablet every morning (Patient not taking: Reported on 12/10/2023) 35 tablet 6   XIFAXAN 550 MG TABS tablet Take 550 mg by mouth 3 (three) times daily. (Patient not taking: Reported on 12/10/2023)     No current facility-administered medications on file prior to visit.    No Known Allergies  Physical Exam:    Vitals:   12/10/23 1431  BP: 104/66  Pulse: 71  Weight: 140 lb 3.2 oz (63.6 kg)  Height: 5' 4.27" (1.632 m)   Wt Readings from Last 3 Encounters:  12/10/23 140 lb 3.2 oz (63.6 kg)  04/06/21 145 lb 3.2 oz (65.9 kg) (79%, Z= 0.80)*  02/20/21 144 lb (65.3 kg) (78%, Z= 0.77)*   * Growth percentiles are based on CDC (Girls, 2-20 Years) data.     Growth %ile SmartLinks can only be used for patients less than 79 years old. No LMP recorded.  Physical Exam Constitutional:      General: She is not in acute distress.    Appearance: She is well-developed.  HENT:     Head: Normocephalic and atraumatic.     Mouth/Throat:     Mouth: Mucous membranes are moist.  Eyes:     General: No scleral icterus.    Extraocular Movements: Extraocular movements intact.     Pupils: Pupils are equal, round, and  reactive to light.  Neck:     Thyroid : No thyromegaly.  Cardiovascular:     Rate and Rhythm: Normal rate and regular rhythm.     Heart sounds: Normal heart sounds. No murmur heard. Pulmonary:     Effort: Pulmonary effort is normal.     Breath sounds: Normal breath sounds.  Abdominal:     Palpations: Abdomen is soft.  Musculoskeletal:        General: Normal range of motion.     Cervical back: Normal range of motion and neck supple.  Lymphadenopathy:     Cervical: No cervical adenopathy.  Skin:    General: Skin is warm and dry.     Capillary Refill: Capillary refill takes less than 2 seconds.     Findings: No rash.  Neurological:     Mental Status: She is alert and oriented to person, place, and time.     Cranial Nerves: No cranial nerve deficit.  Psychiatric:        Behavior: Behavior normal.        Thought Content: Thought content normal.        Judgment: Judgment normal.      Assessment/Plan: 1. Adjustment disorder with mixed anxiety and depressed mood (Primary) 2. Mixed obsessional thoughts and acts 3. Sleep disturbance  -reviewed screening tools, safety confirmed; doing well on current regimen; she will check [employer] website for options for transition to adult care; return precautions reviewed - hydrOXYzine  (ATARAX ) 10 MG tablet; Take 1 tablet (10 mg total) by mouth 3 (three) times daily as needed.  Dispense: 90 tablet; Refill: 0 - sertraline  (ZOLOFT ) 50 MG tablet; Take 1 tablet (50 mg total) by mouth daily.  Dispense: 90 tablet; Refill: 0  4. Pregnancy examination or test, negative result - POCT urine pregnancy  5. Routine screening for STI (sexually transmitted infection) - C. trachomatis/N. gonorrhoeae RNA  6. Surveillance for birth control, oral contraceptives -refill today, continue with method - Norgestimate-Ethinyl Estradiol Triphasic (TRI-ESTARYLLA ) 0.18/0.215/0.25 MG-35 MCG tablet; Take 1 tablet by mouth daily.  Dispense: 28 tablet; Refill: 12

## 2023-12-11 LAB — C. TRACHOMATIS/N. GONORRHOEAE RNA
C. trachomatis RNA, TMA: NOT DETECTED
N. gonorrhoeae RNA, TMA: NOT DETECTED

## 2023-12-12 ENCOUNTER — Encounter: Payer: Self-pay | Admitting: Family

## 2023-12-12 MED ORDER — HYDROXYZINE HCL 10 MG PO TABS: 10.0000 mg | ORAL_TABLET | Freq: Three times a day (TID) | ORAL | 0 refills | Status: AC | PRN

## 2023-12-12 MED ORDER — NORGESTIM-ETH ESTRAD TRIPHASIC 0.18/0.215/0.25 MG-35 MCG PO TABS: 1.0000 | ORAL_TABLET | Freq: Every day | ORAL | 12 refills | Status: AC

## 2023-12-12 MED ORDER — SERTRALINE HCL 50 MG PO TABS
50.0000 mg | ORAL_TABLET | Freq: Every day | ORAL | 0 refills | Status: DC
Start: 2023-12-12 — End: 2024-02-24

## 2024-02-24 ENCOUNTER — Other Ambulatory Visit: Payer: Self-pay | Admitting: Family

## 2024-02-24 ENCOUNTER — Telehealth: Payer: Self-pay | Admitting: *Deleted

## 2024-02-24 DIAGNOSIS — F422 Mixed obsessional thoughts and acts: Secondary | ICD-10-CM

## 2024-02-24 DIAGNOSIS — F4323 Adjustment disorder with mixed anxiety and depressed mood: Secondary | ICD-10-CM

## 2024-02-24 MED ORDER — SERTRALINE HCL 50 MG PO TABS: 50.0000 mg | ORAL_TABLET | Freq: Every day | ORAL | 0 refills | Status: DC

## 2024-02-24 NOTE — Telephone Encounter (Signed)
 Dama called the refill line for new script for zoloft .

## 2024-03-13 ENCOUNTER — Telehealth: Admitting: Family

## 2024-03-20 ENCOUNTER — Telehealth: Admitting: Family

## 2024-03-30 ENCOUNTER — Encounter: Payer: Self-pay | Admitting: Family

## 2024-03-30 ENCOUNTER — Other Ambulatory Visit: Payer: Self-pay | Admitting: Family

## 2024-03-30 DIAGNOSIS — F422 Mixed obsessional thoughts and acts: Secondary | ICD-10-CM

## 2024-03-30 DIAGNOSIS — F4323 Adjustment disorder with mixed anxiety and depressed mood: Secondary | ICD-10-CM

## 2024-04-27 DIAGNOSIS — R059 Cough, unspecified: Secondary | ICD-10-CM | POA: Diagnosis not present

## 2024-04-27 DIAGNOSIS — R002 Palpitations: Secondary | ICD-10-CM | POA: Diagnosis not present

## 2024-04-27 DIAGNOSIS — Z3202 Encounter for pregnancy test, result negative: Secondary | ICD-10-CM | POA: Diagnosis not present

## 2024-04-27 DIAGNOSIS — Z20822 Contact with and (suspected) exposure to covid-19: Secondary | ICD-10-CM | POA: Diagnosis not present

## 2024-05-18 ENCOUNTER — Telehealth: Payer: Self-pay

## 2024-05-18 NOTE — Telephone Encounter (Signed)
 OK thank you, I left her a message

## 2024-05-18 NOTE — Telephone Encounter (Signed)
 Patient called nurse line to request refill on Setraline 50 mg.

## 2024-05-23 DIAGNOSIS — J069 Acute upper respiratory infection, unspecified: Secondary | ICD-10-CM | POA: Diagnosis not present

## 2024-05-23 DIAGNOSIS — J45901 Unspecified asthma with (acute) exacerbation: Secondary | ICD-10-CM | POA: Diagnosis not present

## 2024-05-30 DIAGNOSIS — J329 Chronic sinusitis, unspecified: Secondary | ICD-10-CM | POA: Diagnosis not present

## 2024-05-30 DIAGNOSIS — Z8709 Personal history of other diseases of the respiratory system: Secondary | ICD-10-CM | POA: Diagnosis not present

## 2024-05-30 DIAGNOSIS — J209 Acute bronchitis, unspecified: Secondary | ICD-10-CM | POA: Diagnosis not present

## 2024-08-10 ENCOUNTER — Telehealth: Payer: Self-pay | Admitting: *Deleted

## 2024-08-10 NOTE — Telephone Encounter (Signed)
 Valerie Mason left message for Sertraline  refill on nurse line.

## 2024-08-11 ENCOUNTER — Other Ambulatory Visit: Payer: Self-pay | Admitting: Family

## 2024-08-11 DIAGNOSIS — F422 Mixed obsessional thoughts and acts: Secondary | ICD-10-CM

## 2024-08-11 DIAGNOSIS — F4323 Adjustment disorder with mixed anxiety and depressed mood: Secondary | ICD-10-CM

## 2024-08-11 MED ORDER — SERTRALINE HCL 50 MG PO TABS: 50.0000 mg | ORAL_TABLET | Freq: Every day | ORAL | 0 refills | Status: AC
